# Patient Record
Sex: Male | Born: 1960 | Race: White | Hispanic: No | Marital: Single | Smoking: Never smoker
Health system: Southern US, Community
[De-identification: ages and names within clinical notes are randomized; demographics above are authoritative.]

## PROBLEM LIST (undated history)

## (undated) DIAGNOSIS — C4491 Basal cell carcinoma of skin, unspecified: Secondary | ICD-10-CM

## (undated) DIAGNOSIS — C4492 Squamous cell carcinoma of skin, unspecified: Secondary | ICD-10-CM

## (undated) DIAGNOSIS — T7840XA Allergy, unspecified, initial encounter: Secondary | ICD-10-CM

## (undated) HISTORY — DX: Squamous cell carcinoma of skin, unspecified: C44.92

## (undated) HISTORY — DX: Allergy, unspecified, initial encounter: T78.40XA

## (undated) HISTORY — DX: Basal cell carcinoma of skin, unspecified: C44.91

---

## 1999-07-27 DIAGNOSIS — L4050 Arthropathic psoriasis, unspecified: Secondary | ICD-10-CM | POA: Insufficient documentation

## 1999-07-27 DIAGNOSIS — E139 Other specified diabetes mellitus without complications: Secondary | ICD-10-CM | POA: Insufficient documentation

## 2007-07-22 DIAGNOSIS — I10 Essential (primary) hypertension: Secondary | ICD-10-CM | POA: Insufficient documentation

## 2007-07-22 DIAGNOSIS — E119 Type 2 diabetes mellitus without complications: Secondary | ICD-10-CM | POA: Insufficient documentation

## 2007-07-22 DIAGNOSIS — R809 Proteinuria, unspecified: Secondary | ICD-10-CM | POA: Insufficient documentation

## 2007-07-22 DIAGNOSIS — E7800 Pure hypercholesterolemia, unspecified: Secondary | ICD-10-CM | POA: Insufficient documentation

## 2013-06-25 ENCOUNTER — Ambulatory Visit (HOSPITAL_COMMUNITY)
Admission: RE | Admit: 2013-06-25 | Discharge: 2013-06-25 | Disposition: A | Discharge status: Home / Self Care | Payer: Managed Care, Other (non HMO) | Source: Ambulatory Visit | Attending: Orthopaedic Surgery | Admitting: Orthopaedic Surgery

## 2013-06-25 ENCOUNTER — Other Ambulatory Visit (HOSPITAL_COMMUNITY): Payer: Self-pay | Admitting: Orthopaedic Surgery

## 2013-06-25 DIAGNOSIS — M25562 Pain in left knee: Secondary | ICD-10-CM

## 2013-06-25 DIAGNOSIS — Z1389 Encounter for screening for other disorder: Secondary | ICD-10-CM | POA: Insufficient documentation

## 2015-03-24 IMAGING — CR DG ORBITS FOR FOREIGN BODY
2 series · 2 of 2 positions shown · non-contrast
Comparison: None.

CLINICAL DATA: Metal working/exposure; clearance prior to MRI

ORBITS FOR FOREIGN BODY - 2 VIEW

[w waters (1 of 2)]
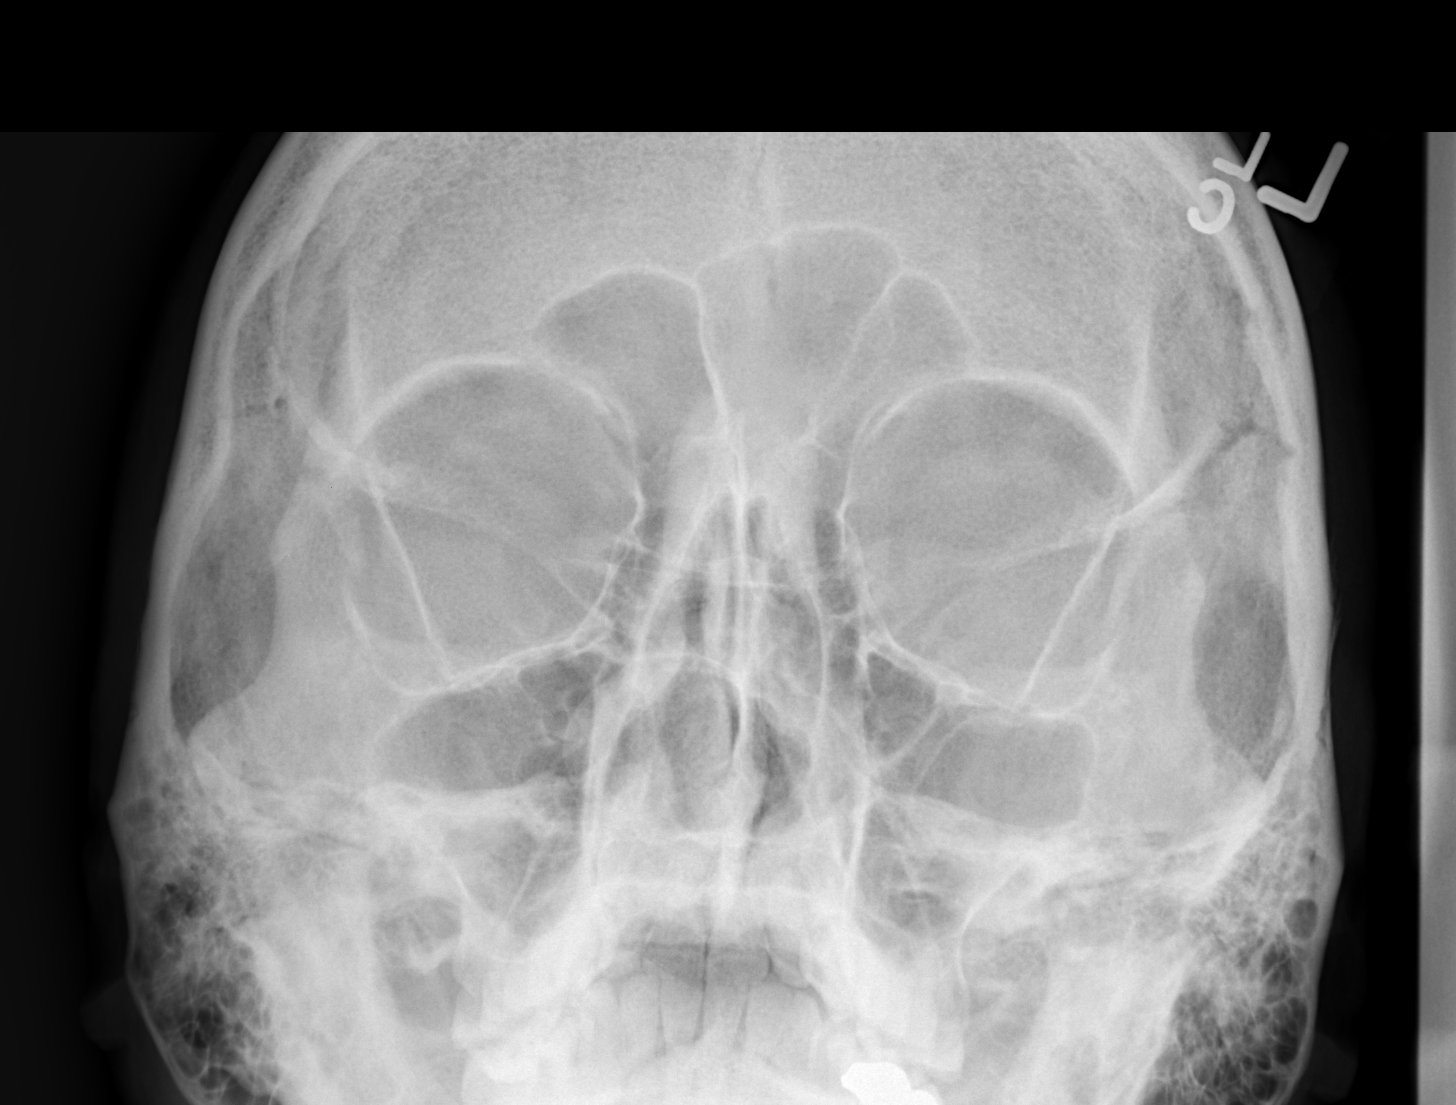

[w waters (2 of 2)]
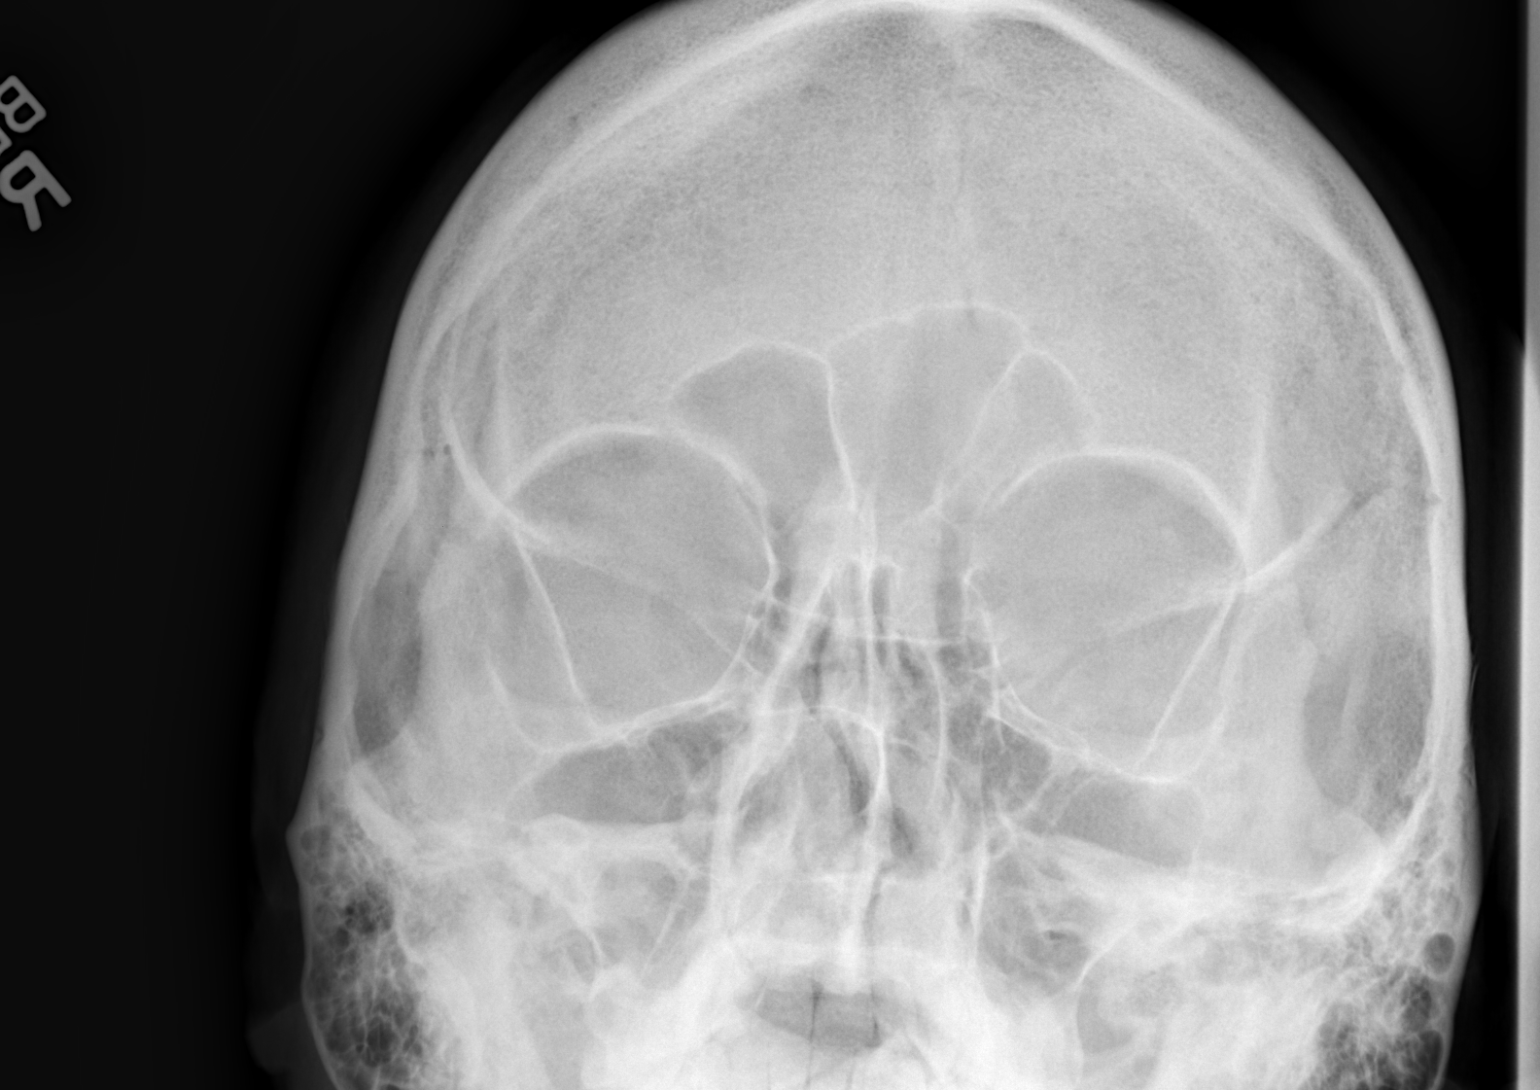

[2 of 2 positions shown; findings below may reference images not displayed]

FINDINGS: There is no evidence of metallic foreign body within the
orbits.  No significant bone abnormality identified.
IMPRESSION: No evidence of metallic foreign body within the orbits.

## 2016-09-17 DIAGNOSIS — R140 Abdominal distension (gaseous): Secondary | ICD-10-CM | POA: Insufficient documentation

## 2016-09-17 DIAGNOSIS — K828 Other specified diseases of gallbladder: Secondary | ICD-10-CM | POA: Insufficient documentation

## 2016-12-20 DIAGNOSIS — N400 Enlarged prostate without lower urinary tract symptoms: Secondary | ICD-10-CM | POA: Insufficient documentation

## 2016-12-20 DIAGNOSIS — J301 Allergic rhinitis due to pollen: Secondary | ICD-10-CM | POA: Insufficient documentation

## 2016-12-20 DIAGNOSIS — G4733 Obstructive sleep apnea (adult) (pediatric): Secondary | ICD-10-CM | POA: Insufficient documentation

## 2020-06-02 DIAGNOSIS — Z8639 Personal history of other endocrine, nutritional and metabolic disease: Secondary | ICD-10-CM | POA: Insufficient documentation

## 2020-06-02 DIAGNOSIS — N400 Enlarged prostate without lower urinary tract symptoms: Secondary | ICD-10-CM | POA: Insufficient documentation

## 2020-06-02 DIAGNOSIS — R030 Elevated blood-pressure reading, without diagnosis of hypertension: Secondary | ICD-10-CM | POA: Insufficient documentation

## 2020-06-02 DIAGNOSIS — I10 Essential (primary) hypertension: Secondary | ICD-10-CM | POA: Insufficient documentation

## 2020-06-02 DIAGNOSIS — M1990 Unspecified osteoarthritis, unspecified site: Secondary | ICD-10-CM | POA: Insufficient documentation

## 2020-09-22 DIAGNOSIS — E781 Pure hyperglyceridemia: Secondary | ICD-10-CM | POA: Insufficient documentation

## 2020-09-22 DIAGNOSIS — E785 Hyperlipidemia, unspecified: Secondary | ICD-10-CM | POA: Insufficient documentation

## 2021-03-09 ENCOUNTER — Ambulatory Visit (INDEPENDENT_AMBULATORY_CARE_PROVIDER_SITE_OTHER): Payer: BC Managed Care – PPO | Admitting: Dermatology

## 2021-03-09 ENCOUNTER — Other Ambulatory Visit: Payer: Self-pay

## 2021-03-09 ENCOUNTER — Encounter: Payer: Self-pay | Admitting: Dermatology

## 2021-03-09 DIAGNOSIS — Z1283 Encounter for screening for malignant neoplasm of skin: Secondary | ICD-10-CM

## 2021-03-09 DIAGNOSIS — D485 Neoplasm of uncertain behavior of skin: Secondary | ICD-10-CM | POA: Diagnosis not present

## 2021-03-09 DIAGNOSIS — Z85828 Personal history of other malignant neoplasm of skin: Secondary | ICD-10-CM | POA: Diagnosis not present

## 2021-03-09 DIAGNOSIS — L719 Rosacea, unspecified: Secondary | ICD-10-CM | POA: Diagnosis not present

## 2021-03-09 DIAGNOSIS — L570 Actinic keratosis: Secondary | ICD-10-CM | POA: Diagnosis not present

## 2021-03-09 MED ORDER — IVERMECTIN 1 % EX CREA: TOPICAL_CREAM | CUTANEOUS | 6 refills | Status: AC

## 2021-03-09 NOTE — Patient Instructions (Signed)

## 2021-03-12 NOTE — Progress Notes (Signed)
   Follow-Up Visit   Subjective  George Barrett is a 60 y.o. male who presents for the following: Skin Problem (Patient here today for lesion on the right side of his nose x 1 month no bleeding, per patient it's dry and non healing. Personal history of non mole skin cancer. No personal history of atypical moles, or melanoma. No family history of atypical moles, melanoma or non mole skin cancer.).  General skin check, spot on nose Location:  Duration:  Quality:  Associated Signs/Symptoms: Modifying Factors:  Severity:  Timing: Context:   Objective  Well appearing patient in no apparent distress; mood and affect are within normal limits. Left Breast Waist up skin examination, no atypical pigmented lesions.  Right Supratip of Nose Ill-defined fibrotic pearly 7 mm endophytic papule.     Left Nasal Sidewall Central facial slightly edematous patchy erythema  Left Mid Helix, Mid Parietal Scalp (2) Pink hornlike 3 to 4 mm crusts    A full examination was performed including scalp, head, eyes, ears, nose, lips, neck, chest, axillae, abdomen, back, buttocks, bilateral upper extremities, bilateral lower extremities, hands, feet, fingers, toes, fingernails, and toenails. All findings within normal limits unless otherwise noted below.   Assessment & Plan    Encounter for screening for malignant neoplasm of skin Left Breast  Annual skin examination, encouraged to self examine twice annually.  Neoplasm of uncertain behavior of skin Right Supratip of Nose  Skin / nail biopsy Type of biopsy: tangential   Informed consent: discussed and consent obtained   Timeout: patient name, date of birth, surgical site, and procedure verified   Procedure prep:  Patient was prepped and draped in usual sterile fashion (Non sterile) Prep type:  Chlorhexidine Anesthesia: the lesion was anesthetized in a standard fashion   Anesthetic:  1% lidocaine w/ epinephrine 1-100,000 local  infiltration Instrument used: flexible razor blade   Outcome: patient tolerated procedure well   Post-procedure details: wound care instructions given    Specimen 1 - Surgical pathology Differential Diagnosis: bcc vs scc  Check Margins: No  Will refer for Mohs surgery if biopsy shows skin cancer.  Rosacea Left Nasal Sidewall  Nightly application of cilantro (or generic ivermectin) areas prone to breakout.  Follow-up by phone in 6 weeks with status report  Ivermectin (SOOLANTRA) 1 % CREA - Left Nasal Sidewall Apply to affected area q day  AK (actinic keratosis) (3) Left Mid Helix; Mid Parietal Scalp (2)  Destruction of lesion - Left Mid Helix, Mid Parietal Scalp (2) Complexity: simple   Destruction method: cryotherapy   Informed consent: discussed and consent obtained   Timeout:  patient name, date of birth, surgical site, and procedure verified Lesion destroyed using liquid nitrogen: Yes   Cryotherapy cycles:  3 Outcome: patient tolerated procedure well with no complications        I, Lavonna Monarch, MD, have reviewed all documentation for this visit.  The documentation on 03/12/21 for the exam, diagnosis, procedures, and orders are all accurate and complete.

## 2021-03-25 ENCOUNTER — Telehealth: Payer: Self-pay | Admitting: *Deleted

## 2021-03-25 NOTE — Telephone Encounter (Signed)
Path to patient. He will call back in 1 month and if lesion is not gone he will make follow up appointment.

## 2021-09-30 ENCOUNTER — Other Ambulatory Visit: Payer: Self-pay

## 2021-09-30 ENCOUNTER — Ambulatory Visit (INDEPENDENT_AMBULATORY_CARE_PROVIDER_SITE_OTHER): Payer: BC Managed Care – PPO | Admitting: Dermatology

## 2021-09-30 ENCOUNTER — Encounter: Payer: Self-pay | Admitting: Dermatology

## 2021-09-30 DIAGNOSIS — L820 Inflamed seborrheic keratosis: Secondary | ICD-10-CM

## 2021-09-30 DIAGNOSIS — D0439 Carcinoma in situ of skin of other parts of face: Secondary | ICD-10-CM | POA: Diagnosis not present

## 2021-09-30 DIAGNOSIS — D1801 Hemangioma of skin and subcutaneous tissue: Secondary | ICD-10-CM

## 2021-09-30 DIAGNOSIS — D485 Neoplasm of uncertain behavior of skin: Secondary | ICD-10-CM

## 2021-09-30 DIAGNOSIS — D0422 Carcinoma in situ of skin of left ear and external auricular canal: Secondary | ICD-10-CM

## 2021-09-30 DIAGNOSIS — L918 Other hypertrophic disorders of the skin: Secondary | ICD-10-CM | POA: Diagnosis not present

## 2021-09-30 DIAGNOSIS — L570 Actinic keratosis: Secondary | ICD-10-CM | POA: Diagnosis not present

## 2021-09-30 NOTE — Patient Instructions (Signed)

## 2021-10-06 ENCOUNTER — Telehealth: Payer: Self-pay

## 2021-10-06 NOTE — Telephone Encounter (Signed)
Path to patient Feb visit made for the 2 lesions   2. 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

## 2021-10-06 NOTE — Telephone Encounter (Signed)
-----   Message from Lavonna Monarch, MD sent at 10/03/2021  5:02 PM EST ----- Schedule surgery with Dr. Darene Lamer

## 2021-10-30 ENCOUNTER — Encounter: Payer: Self-pay | Admitting: Dermatology

## 2021-10-30 NOTE — Progress Notes (Addendum)
Follow-Up Visit   Subjective  George Barrett is a 61 y.o. male who presents for the following: Annual Exam (Few scaly spots on face & left ear rim- removed in past but has grown back & tip of nose- dry spot).  Several new crusts face including recurrence Location:  Duration:  Quality:  Associated Signs/Symptoms: Modifying Factors:  Severity:  Timing: Context:   Objective  Well appearing patient in no apparent distress; mood and affect are within normal limits. Neck - Posterior Fleshy, skin-colored  pedunculated papules.    Abdomen (Lower Torso, Anterior), Chest (Upper Torso, Anterior), Mid Frontal Scalp, Torso - Posterior (Back) Multiple smooth red 1 mm dermal papules   Right Temporal Scalp 3 waxy pink 7 mm crust face, rule out superficial carcinomas       Right Zygomatic Area       Left Mid Helix       Left Parotid Area, Left Zygomatic Area, Mid Tip of Nose Multiple pink gritty crusts     All skin waist up examined.   Assessment & Plan    Skin tag Neck - Posterior  Benign, ok to leave unless pt wants removal   Cherry angioma (4) Chest (Upper Torso, Anterior); Abdomen (Lower Torso, Anterior); Torso - Posterior (Back); Mid Frontal Scalp  Benign, ok to leave   Neoplasm of uncertain behavior of skin (3) Right Temporal Scalp  Skin / nail biopsy Type of biopsy: tangential   Informed consent: discussed and consent obtained   Timeout: patient name, date of birth, surgical site, and procedure verified   Anesthesia: the lesion was anesthetized in a standard fashion   Anesthetic:  1% lidocaine w/ epinephrine 1-100,000 local infiltration Instrument used: flexible razor blade   Hemostasis achieved with: aluminum chloride and electrodesiccation   Outcome: patient tolerated procedure well   Post-procedure details: wound care instructions given    Specimen 1 - Surgical pathology Differential Diagnosis: R/O BCC VS SCC  Check Margins: No  Right  Zygomatic Area  Skin / nail biopsy Type of biopsy: tangential   Informed consent: discussed and consent obtained   Timeout: patient name, date of birth, surgical site, and procedure verified   Anesthesia: the lesion was anesthetized in a standard fashion   Anesthetic:  1% lidocaine w/ epinephrine 1-100,000 local infiltration Instrument used: flexible razor blade   Hemostasis achieved with: aluminum chloride and electrodesiccation   Outcome: patient tolerated procedure well   Post-procedure details: wound care instructions given    Specimen 2 - Surgical pathology Differential Diagnosis: R/O BCC VS SCC  Check Margins: No  Left Mid Helix  Skin / nail biopsy Type of biopsy: tangential   Informed consent: discussed and consent obtained   Timeout: patient name, date of birth, surgical site, and procedure verified   Anesthesia: the lesion was anesthetized in a standard fashion   Anesthetic:  1% lidocaine w/ epinephrine 1-100,000 local infiltration Instrument used: flexible razor blade   Hemostasis achieved with: aluminum chloride and electrodesiccation   Outcome: patient tolerated procedure well   Post-procedure details: wound care instructions given    Specimen 3 - Surgical pathology Differential Diagnosis: R/O BCC VS SCC  Check Margins: No  Actinic keratosis (3) Mid Tip of Nose; Left Zygomatic Area; Left Parotid Area  Destruction of lesion - Left Parotid Area, Left Zygomatic Area, Mid Tip of Nose Complexity: simple   Destruction method: cryotherapy   Informed consent: discussed and consent obtained   Timeout:  patient name, date of birth, surgical site, and  procedure verified Lesion destroyed using liquid nitrogen: Yes   Cryotherapy cycles:  3 Outcome: patient tolerated procedure well with no complications   Post-procedure details: wound care instructions given        I, Lavonna Monarch, MD, have reviewed all documentation for this visit.  The documentation on 11/23/21  for the exam, diagnosis, procedures, and orders are all accurate and complete.

## 2021-11-17 NOTE — Addendum Note (Signed)
Addended by: Lennie Odor on: 11/17/2021 10:13 AM   Modules accepted: Orders

## 2021-11-26 ENCOUNTER — Encounter: Payer: Self-pay | Admitting: Dermatology

## 2021-11-26 ENCOUNTER — Other Ambulatory Visit: Payer: Self-pay

## 2021-11-26 ENCOUNTER — Ambulatory Visit (INDEPENDENT_AMBULATORY_CARE_PROVIDER_SITE_OTHER): Payer: BC Managed Care – PPO | Admitting: Dermatology

## 2021-11-26 DIAGNOSIS — D485 Neoplasm of uncertain behavior of skin: Secondary | ICD-10-CM | POA: Diagnosis not present

## 2021-11-26 DIAGNOSIS — D0439 Carcinoma in situ of skin of other parts of face: Secondary | ICD-10-CM | POA: Diagnosis not present

## 2021-11-26 DIAGNOSIS — D0422 Carcinoma in situ of skin of left ear and external auricular canal: Secondary | ICD-10-CM

## 2021-11-26 DIAGNOSIS — D099 Carcinoma in situ, unspecified: Secondary | ICD-10-CM

## 2021-11-26 DIAGNOSIS — L570 Actinic keratosis: Secondary | ICD-10-CM | POA: Diagnosis not present

## 2021-11-26 NOTE — Patient Instructions (Signed)

## 2021-12-08 ENCOUNTER — Encounter: Payer: Self-pay | Admitting: Dermatology

## 2021-12-08 NOTE — Progress Notes (Signed)
Follow-Up Visit   Subjective  George Barrett is a 61 y.o. male who presents for the following: Procedure (Here for treatment- right zygomatic area & left mid helix- cis x 2 ).  Biopsy-proven skin cancers right cheek and left ear plus several new crusts Location:  Duration:  Quality:  Associated Signs/Symptoms: Modifying Factors:  Severity:  Timing: Context:   Objective  Well appearing patient in no apparent distress; mood and affect are within normal limits. Left Mid Helix Lesion identified by Dr.Tanna Loeffler and nurse in room.    Left Superior Helix 7 mm focally eroded waxy pink crust       Dorsum of Nose, Left Parotid Area Gritty 4 mm pink crusts  Right Zygomatic Area Lesion identified by Dr.Raphaella Larkin and nurse in room.      A focused examination was performed including head and neck. Relevant physical exam findings are noted in the Assessment and Plan.   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  Destruction of lesion Complexity: simple   Destruction method: electrodesiccation and curettage   Informed consent: discussed and consent obtained   Timeout:  patient name, date of birth, surgical site, and procedure verified Anesthesia: the lesion was anesthetized in a standard fashion   Anesthetic:  1% lidocaine w/ epinephrine 1-100,000 local infiltration Curettage performed in three different directions: Yes   Curettage cycles:  3 Lesion length (cm):  0.5 Lesion width (cm):  0.5 Margin per side (cm):  0 Final wound size (cm):  0.5 Hemostasis achieved with:  ferric subsulfate Outcome: patient tolerated procedure well with no complications   Post-procedure details: sterile dressing applied and wound care instructions given   Dressing type: bandage and petrolatum   Additional details:  Wound inoculated with 5% Fluorouracil.    Neoplasm of uncertain behavior of skin Left Superior Helix  Skin / nail  biopsy Type of biopsy: tangential   Informed consent: discussed and consent obtained   Timeout: patient name, date of birth, surgical site, and procedure verified   Anesthesia: the lesion was anesthetized in a standard fashion   Anesthetic:  1% lidocaine w/ epinephrine 1-100,000 local infiltration Instrument used: flexible razor blade   Hemostasis achieved with: ferric subsulfate and electrodesiccation   Outcome: patient tolerated procedure well   Post-procedure details: wound care instructions given    Destruction of lesion Complexity: simple   Destruction method: electrodesiccation and curettage   Informed consent: discussed and consent obtained   Timeout:  patient name, date of birth, surgical site, and procedure verified Anesthesia: the lesion was anesthetized in a standard fashion   Anesthetic:  1% lidocaine w/ epinephrine 1-100,000 local infiltration Curettage performed in three different directions: Yes   Electrodesiccation performed over the curetted area: Yes   Curettage cycles:  3 Lesion length (cm):  0.6 Lesion width (cm):  0.6 Margin per side (cm):  0 Final wound size (cm):  0.6 Hemostasis achieved with:  ferric subsulfate Outcome: patient tolerated procedure well with no complications   Post-procedure details: sterile dressing applied and wound care instructions given   Dressing type: bandage and petrolatum   Additional details:  Wound inoculated with 5% Fluorouracil.    Specimen 1 - Surgical pathology Differential Diagnosis: R/O BCC VS SCC - TXPBX  Check Margins: No  Actinic keratosis (2) Dorsum of Nose; Left Parotid Area  Destruction of lesion - Dorsum of Nose, Left Parotid Area Complexity: simple   Destruction method: cryotherapy  Informed consent: discussed and consent obtained   Lesion destroyed using liquid nitrogen: Yes   Cryotherapy cycles:  3 Outcome: patient tolerated procedure well with no complications    Squamous cell carcinoma in situ  (SCCIS) Right Zygomatic Area  Destruction of lesion Complexity: simple   Destruction method: electrodesiccation and curettage   Informed consent: discussed and consent obtained   Timeout:  patient name, date of birth, surgical site, and procedure verified Anesthesia: the lesion was anesthetized in a standard fashion   Anesthetic:  1% lidocaine w/ epinephrine 1-100,000 local infiltration Curettage performed in three different directions: Yes   Electrodesiccation performed over the curetted area: Yes   Curettage cycles:  3 Lesion length (cm):  0.6 Lesion width (cm):  0.6 Margin per side (cm):  0 Final wound size (cm):  0.6 Hemostasis achieved with:  ferric subsulfate Outcome: patient tolerated procedure well with no complications   Post-procedure details: sterile dressing applied and wound care instructions given   Dressing type: bandage and petrolatum   Additional details:  Wound inoculated with 5% Fluorouracil.        I, Lavonna Monarch, MD, have reviewed all documentation for this visit.  The documentation on 12/08/21 for the exam, diagnosis, procedures, and orders are all accurate and complete.

## 2022-03-15 ENCOUNTER — Ambulatory Visit (INDEPENDENT_AMBULATORY_CARE_PROVIDER_SITE_OTHER): Payer: BC Managed Care – PPO | Admitting: Dermatology

## 2022-03-15 ENCOUNTER — Encounter: Payer: Self-pay | Admitting: Dermatology

## 2022-03-15 DIAGNOSIS — L570 Actinic keratosis: Secondary | ICD-10-CM | POA: Diagnosis not present

## 2022-03-15 MED ORDER — IMIQUIMOD 5 % EX CREA: TOPICAL_CREAM | CUTANEOUS | 0 refills | Status: DC

## 2022-04-05 ENCOUNTER — Encounter: Payer: Self-pay | Admitting: Dermatology

## 2022-04-05 NOTE — Progress Notes (Signed)
   Follow-Up Visit   Subjective  George Barrett is a 62 y.o. male who presents for the following: Follow-up (F/u - left ear rim still little crusty & nose- peels).  Persistent crust at site of most recent biopsy on left ear, new crust on lower nose Location:  Duration:  Quality:  Associated Signs/Symptoms: Modifying Factors:  Severity:  Timing: Context:   Objective  Well appearing patient in no apparent distress; mood and affect are within normal limits. Left Superior Helix, Mid Supratip of Nose Left ear has been biopsied twice in the last year, superior helix showed a hypertrophic AK and mid helix showed a carcinoma in situ.  The persistent scale in the year is at the site of the AK biopsy and patient is agreeable to trying topical Aldara instead of repeat biopsy or freezing.  Smaller 3 mm gritty crust actinic keratosis on the lower ball of the nose.    A focused examination was performed including the head and neck. Relevant physical exam findings are noted in the Assessment and Plan.   Assessment & Plan    AK (actinic keratosis) (2) Left Superior Helix; Mid Supratip of Nose  Aldara 3 times weekly for 6 to 8 weeks or until there is brisk inflammation.  Contact me with any issues.  imiquimod (ALDARA) 5 % cream - Left Superior Helix, Mid Supratip of Nose Apply to affected area qhs x 23-76 application- avoid sunlight & expect irradiation      I, Lavonna Monarch, MD, have reviewed all documentation for this visit.  The documentation on 04/05/22 for the exam, diagnosis, procedures, and orders are all accurate and complete.

## 2022-04-23 DIAGNOSIS — E538 Deficiency of other specified B group vitamins: Secondary | ICD-10-CM | POA: Insufficient documentation

## 2022-10-11 ENCOUNTER — Ambulatory Visit: Payer: BC Managed Care – PPO | Admitting: Dermatology

## 2023-01-31 ENCOUNTER — Ambulatory Visit (INDEPENDENT_AMBULATORY_CARE_PROVIDER_SITE_OTHER): Payer: BC Managed Care – PPO | Admitting: Dermatology

## 2023-01-31 ENCOUNTER — Encounter: Payer: Self-pay | Admitting: Dermatology

## 2023-01-31 VITALS — BP 107/71

## 2023-01-31 DIAGNOSIS — L821 Other seborrheic keratosis: Secondary | ICD-10-CM | POA: Diagnosis not present

## 2023-01-31 DIAGNOSIS — L814 Other melanin hyperpigmentation: Secondary | ICD-10-CM

## 2023-01-31 DIAGNOSIS — L570 Actinic keratosis: Secondary | ICD-10-CM

## 2023-01-31 DIAGNOSIS — Z1283 Encounter for screening for malignant neoplasm of skin: Secondary | ICD-10-CM | POA: Diagnosis not present

## 2023-01-31 DIAGNOSIS — L820 Inflamed seborrheic keratosis: Secondary | ICD-10-CM | POA: Diagnosis not present

## 2023-01-31 DIAGNOSIS — D1801 Hemangioma of skin and subcutaneous tissue: Secondary | ICD-10-CM | POA: Diagnosis not present

## 2023-01-31 DIAGNOSIS — D229 Melanocytic nevi, unspecified: Secondary | ICD-10-CM

## 2023-01-31 DIAGNOSIS — X32XXXA Exposure to sunlight, initial encounter: Secondary | ICD-10-CM

## 2023-01-31 DIAGNOSIS — L578 Other skin changes due to chronic exposure to nonionizing radiation: Secondary | ICD-10-CM

## 2023-01-31 DIAGNOSIS — W908XXA Exposure to other nonionizing radiation, initial encounter: Secondary | ICD-10-CM

## 2023-01-31 NOTE — Patient Instructions (Addendum)
Due to recent changes in healthcare laws, you may see results of your pathology and/or laboratory studies on MyChart before the doctors have had a chance to review them. We understand that in some cases there may be results that are confusing or concerning to you. Please understand that not all results are received at the same time and often the doctors may need to interpret multiple results in order to provide you with the best plan of care or course of treatment. Therefore, we ask that you please give us 2 business days to thoroughly review all your results before contacting the office for clarification. Should we see a critical lab result, you will be contacted sooner.   If You Need Anything After Your Visit  If you have any questions or concerns for your doctor, please call our main line at [phone] If no one answers, please leave a voicemail as directed and we will return your call as soon as possible. Messages left after 4 pm will be answered the following business day.   You may also send us a message via MyChart. We typically respond to MyChart messages within 1-2 business days.  For prescription refills, please ask your pharmacy to contact our office. Our fax number is [phone].  If you have an urgent issue when the clinic is closed that cannot wait until the next business day, you can page your doctor at the number below.    Please note that while we do our best to be available for urgent issues outside of office hours, we are not available 24/7.   If you have an urgent issue and are unable to reach us, you may choose to seek medical care at your doctor's office, retail clinic, urgent care center, or emergency room.  If you have a medical emergency, please immediately call 911 or go to the emergency department. In the event of inclement weather, please call our main line at [phone] for an update on the status of any delays or closures.  Dermatology Medication Tips: Please  keep the boxes that topical medications come in in order to help keep track of the instructions about where and how to use these. Pharmacies typically print the medication instructions only on the boxes and not directly on the medication tubes.   If your medication is too expensive, please contact our office at [phone] or send us a message through MyChart.   We are unable to tell what your co-pay for medications will be in advance as this is different depending on your insurance coverage. However, we may be able to find a substitute medication at lower cost or fill out paperwork to get insurance to cover a needed medication.   If a prior authorization is required to get your medication covered by your insurance company, please allow us 1-2 business days to complete this process.  Drug prices often vary depending on where the prescription is filled and some pharmacies may offer cheaper prices.  The website www.goodrx.com contains coupons for medications through different pharmacies. The prices here do not account for what the cost may be with help from insurance (it may be cheaper with your insurance), but the website can give you the price if you did not use any insurance.  - You can print the associated coupon and take it with your prescription to the pharmacy.  - You may also stop by our office during regular business hours and pick up a GoodRx coupon card.  - If you need your   prescription sent electronically to a different pharmacy, notify our office through [facility] MyChart or by phone at [phone]    Skin Education :   I counseled the patient regarding the following: Sun screen (SPF 30 or greater) should be applied during peak UV exposure (between 10am and 2pm) and reapplied after exercise or swimming.  The ABCDEs of melanoma were reviewed with the patient, and the importance of monthly self-examination of moles was emphasized. Should any moles change in shape or color, or itch,  bleed or burn, pt will contact our office for evaluation sooner then their interval appointment.  Plan: Sunscreen Recommendations I recommended a broad spectrum sunscreen with a SPF of 30 or higher. I explained that SPF 30 sunscreens block approximately 97 percent of the sun's harmful rays. Sunscreens should be applied at least 15 minutes prior to expected sun exposure and then every 2 hours after that as long as sun exposure continues. If swimming or exercising sunscreen should be reapplied every 45 minutes to an hour after getting wet or sweating. One ounce, or the equivalent of a shot glass full of sunscreen, is adequate to protect the skin not covered by a bathing suit. I also recommended a lip balm with a sunscreen as well. Sun protective clothing can be used in lieu of sunscreen but must be worn the entire time you are exposed to the sun's rays. Cryotherapy Aftercare  Wash gently with soap and water everyday.   Apply Vaseline and Band-Aid daily until healed.  

## 2023-01-31 NOTE — Progress Notes (Unsigned)
New Patient Visit    Subjective  George Barrett is a 63 y.o. male who presents for the following: Lesions on ears, face, and neck x 2-3 months. He says he has a history of SCCIS on the left ear which he thinks was frozen or burned off. He has a history of AK'S.    The following portions of the chart were reviewed this encounter and updated as appropriate: medications, allergies, medical history  Review of Systems:  No other skin or systemic complaints except as noted in HPI or Assessment and Plan.  Objective  Well appearing patient in no apparent distress; mood and affect are within normal limits.  .  A waist up examination was performed of the following areas: Upper body exam.  Relevant exam findings are noted in the Assessment and Plan.    Assessment & Plan   LENTIGINES, SEBORRHEIC KERATOSES, HEMANGIOMAS - Benign normal skin lesions - Benign-appearing - Call for any changes  MELANOCYTIC NEVI - Tan-brown and/or pink-flesh-colored symmetric macules and papules - Benign appearing on exam today - Observation - Call clinic for new or changing moles - Recommend daily use of broad spectrum spf 30+ sunscreen to sun-exposed areas.   ACTINIC DAMAGE - Chronic condition, secondary to cumulative UV/sun exposure - diffuse scaly erythematous macules with underlying dyspigmentation - Recommend daily broad spectrum sunscreen SPF 30+ to sun-exposed areas, reapply every 2 hours as needed.  - Staying in the shade or wearing long sleeves, sun glasses (UVA+UVB protection) and wide brim hats (4-inch brim around the entire circumference of the hat) are also recommended for sun protection.  - Call for new or changing lesions.  SKIN CANCER SCREENING PERFORMED TODAY    INFLAMED SEBORRHEIC KERATOSIS Exam: Erythematous keratotic or waxy stuck-on papule or plaque.  Symptomatic, irritating, patient would like treated.  Benign-appearing.  Call clinic for new or changing lesions.   Prior to  procedure, discussed risks of blister formation, small wound, skin dyspigmentation, or rare scar following treatment. Recommend Vaseline ointment to treated areas while healing.  Destruction Procedure Note Destruction method: cryotherapy   Informed consent: discussed and consent obtained   Lesion destroyed using liquid nitrogen: Yes   Outcome: patient tolerated procedure well with no complications   Post-procedure details: wound care instructions given   Locations: left neck # of Lesions Treated: 1   ACTINIC KERATOSIS Exam: Erythematous thin papules/macules with gritty scale  Actinic keratoses are precancerous spots that appear secondary to cumulative UV radiation exposure/sun exposure over time. They are chronic with expected duration over 1 year. A portion of actinic keratoses will progress to squamous cell carcinoma of the skin. It is not possible to reliably predict which spots will progress to skin cancer and so treatment is recommended to prevent development of skin cancer.  Recommend daily broad spectrum sunscreen SPF 30+ to sun-exposed areas, reapply every 2 hours as needed.  Recommend staying in the shade or wearing long sleeves, sun glasses (UVA+UVB protection) and wide brim hats (4-inch brim around the entire circumference of the hat). Call for new or changing lesions.  Treatment Plan:  Prior to procedure, discussed risks of blister formation, small wound, skin dyspigmentation, or rare scar following cryotherapy. Recommend Vaseline ointment to treated areas while healing.  Destruction Procedure Note Destruction method: cryotherapy   Informed consent: discussed and consent obtained   Lesion destroyed using liquid nitrogen: Yes   Outcome: patient tolerated procedure well with no complications   Post-procedure details: wound care instructions given   Locations: right  temple, left cheek, left ear. Left wrist, right forearm # of Lesions Treated: 9      No follow-ups on  file.  Jaclynn Guarneri, CMA, am acting as scribe for Langston Reusing, MD.   Documentation: I have reviewed the above documentation for accuracy and completeness, and I agree with the above.  Langston Reusing, MD

## 2023-08-02 ENCOUNTER — Ambulatory Visit: Payer: BC Managed Care – PPO | Admitting: Dermatology

## 2023-08-04 DIAGNOSIS — E1142 Type 2 diabetes mellitus with diabetic polyneuropathy: Secondary | ICD-10-CM | POA: Insufficient documentation

## 2023-08-09 ENCOUNTER — Encounter: Payer: Self-pay | Admitting: Dermatology

## 2023-08-09 ENCOUNTER — Ambulatory Visit: Payer: BC Managed Care – PPO | Admitting: Dermatology

## 2023-08-09 VITALS — BP 123/83 | HR 69

## 2023-08-09 DIAGNOSIS — W908XXA Exposure to other nonionizing radiation, initial encounter: Secondary | ICD-10-CM | POA: Diagnosis not present

## 2023-08-09 DIAGNOSIS — L570 Actinic keratosis: Secondary | ICD-10-CM | POA: Diagnosis not present

## 2023-08-09 DIAGNOSIS — L578 Other skin changes due to chronic exposure to nonionizing radiation: Secondary | ICD-10-CM

## 2023-08-09 DIAGNOSIS — Z5111 Encounter for antineoplastic chemotherapy: Secondary | ICD-10-CM

## 2023-08-09 DIAGNOSIS — L918 Other hypertrophic disorders of the skin: Secondary | ICD-10-CM | POA: Diagnosis not present

## 2023-08-09 MED ORDER — FLUOROURACIL 5 % EX CREA: TOPICAL_CREAM | Freq: Every evening | CUTANEOUS | 1 refills | Status: AC

## 2023-08-09 MED ORDER — HYDROCORTISONE 2.5 % EX CREA: TOPICAL_CREAM | Freq: Two times a day (BID) | CUTANEOUS | 1 refills | Status: AC

## 2023-08-09 NOTE — Progress Notes (Signed)
Follow-Up Visit   Subjective  George Barrett is a 62 y.o. male who presents for the following: AK  Patient present today for follow up visit for AK. Patient was last evaluated on 01/31/23. Patient reports sxs are better. Patient denies medication changes.  The following portions of the chart were reviewed this encounter and updated as appropriate: medications, allergies, medical history  Review of Systems:  No other skin or systemic complaints except as noted in HPI or Assessment and Plan.  Objective  Well appearing patient in no apparent distress; mood and affect are within normal limits.  A focused examination was performed of the following areas: Face and neck  Relevant exam findings are noted in the Assessment and Plan.  Left Forearm - Posterior (3), Left Temple, Left Zygomatic Area (2), Right Buccal Cheek, Right Elbow - Posterior, Right Forearm - Posterior (2), Right Forehead (2), Right Malar Cheek, Right Preauricular Area, Right Temple Erythematous thin papules/macules with gritty scale.   Chest - Medial [facility]), Left Breast, Neck - Anterior, Neck - Posterior (3), Right Breast Fleshy, skin-colored pedunculated papules    Assessment & Plan   ACTINIC Damage Exam: Diffuse scaly erythematous macules with underlying dyspigmentation. Erythematous thin papules/macules with gritty scale at the temples, cheeks, forehead  Actinic keratoses are precancerous spots that appear secondary to cumulative UV radiation exposure/sun exposure over time. They are chronic with expected duration over 1 year. A portion of actinic keratoses will progress to squamous cell carcinoma of the skin. It is not possible to reliably predict which spots will progress to skin cancer and so treatment is recommended to prevent development of skin cancer.  Recommend daily broad spectrum sunscreen SPF 30+ to sun-exposed areas, reapply every 2 hours as needed.  Recommend staying in the shade or wearing long  sleeves, sun glasses (UVA+UVB protection) and wide brim hats (4-inch brim around the entire circumference of the hat). Call for new or changing lesions.  Treatment Plan: Start 5-fluorouracil cream twice a day for 14 days nightly to affected areas including face and arms.  Reviewed course of treatment and expected reaction.  Patient advised to expect inflammation and crusting and advised that erosions are possible.  Patient advised to be diligent with sun protection during and after treatment. Handout with details of how to apply medication and what to expect provided. Counseled to keep medication out of reach of children and pets.  Reviewed course of treatment and expected reaction.  Patient advised to expect inflammation and crusting and advised that erosions are possible.  Patient advised to be diligent with sun protection during and after treatment. Handout with details of how to apply medication and what to expect provided. Counseled to keep medication out of reach of children and pets.   Inflamed Skin Tags S: The patient complains of symptomatic skin tags on the neck. These are irritated by clothing, jewelry and rubbing.  Several irritated skin tags are noted on the neck.   Prior to procedure, discussed risks of blister formation, small wound, skin dyspigmentation, or rare scar following cryotherapy. Recommend Vaseline ointment to treated areas while healing.    PROCEDURE NOTES  Assessment & Plan   AK (actinic keratosis) (15) Right Elbow - Posterior; Left Forearm - Posterior (3); Right Forearm - Posterior (2); Left Temple; Right Temple; Right Preauricular Area; Left Zygomatic Area (2); Right Malar Cheek; Right Buccal Cheek; Right Forehead (2)  Destruction of lesion - Left Forearm - Posterior (3), Left Temple, Left Zygomatic Area (2), Right Buccal Cheek, Right  Elbow - Posterior, Right Forearm - Posterior (2), Right Forehead (2), Right Malar Cheek, Right Preauricular Area, Right  Temple Complexity: simple   Destruction method: cryotherapy   Informed consent: discussed and consent obtained   Timeout:  patient name, date of birth, surgical site, and procedure verified Lesion destroyed using liquid nitrogen: Yes   Cryotherapy cycles:  16 Post-procedure details: wound care instructions given    Related Medications hydrocortisone 2.5 % cream Apply topically 2 (two) times daily.  fluorouracil (EFUDEX) 5 % cream Apply topically at bedtime. START USE IN JANUARY  Inflamed skin tag (7) Neck - Anterior; Neck - Posterior (3); Chest - Medial [facility]); Left Breast; Right Breast  Symptomatic, irritating, patient would like treated.  Benign-appearing.  Call clinic for new or changing lesions.    Destruction of lesion - Chest - Medial [facility]), Left Breast, Neck - Anterior, Neck - Posterior (3), Right Breast Complexity: simple   Destruction method: cryotherapy   Informed consent: discussed and consent obtained   Timeout:  patient name, date of birth, surgical site, and procedure verified Lesion destroyed using liquid nitrogen: Yes   Cryotherapy cycles:  5 Post-procedure details: wound care instructions given       Return in about 5 months (around 01/07/2024) for AK F/U.    Documentation: I have reviewed the above documentation for accuracy and completeness, and I agree with the above.  Stasia Cavalier, am acting as scribe for Langston Reusing, DO.  Langston Reusing, DO

## 2023-08-09 NOTE — Patient Instructions (Addendum)
Hello George Barrett,  Thank you for visiting [place] today. We appreciate your commitment to managing your skin health, especially in addressing the precancerous spots and preparing for winter skin care. Here is a summary of the key instructions from today's consultation:  - Freezing Treatment: Several spots were treated today, including on your left wrist and a spot of actinic keratosis.   - Application: Continue to apply Aquaphor or Vaseline on these treated areas morning and night starting tonight.  - Field Therapy Plan for January:   - Treatment: We discussed starting Efudex (5-Fluorouracil) cream in January.   - Application: Apply this cream at night for two weeks to the forehead, nose, and cheeks.  - Post-Efudex Care:   - Treatment: After the two-week application of Efudex, use hydrocortisone 2.5% ointment for another two weeks to aid healing.  - Daily Skin Care:   - Routine: Use Aquaphor and sunscreen every morning. This is crucial for protecting and healing your skin.  - Follow-Up Appointment:   - Schedule: We will see you back in April for a full skin cancer screening and to assess the treatment's effectiveness.  - Educational Material:   - Resources Provided: I showed you some images of expected reactions to the treatments and provided a sample of Vaseline to start your care this evening.  We look forward to seeing the positive changes in your next visit. If you have any questions about the treatment or experience severe irritation, please do not hesitate to contact our office.  Wishing you a healthy and safe winter season.  Warm regards,  Dr. Langston Reusing,  Dermatology      Cryotherapy Aftercare  Wash gently with soap and water everyday.   Apply Vaseline and Band-Aid daily until healed.    Important Information   Due to recent changes in healthcare laws, you may see results of your pathology and/or laboratory studies on MyChart before the doctors have had a chance  to review them. We understand that in some cases there may be results that are confusing or concerning to you. Please understand that not all results are received at the same time and often the doctors may need to interpret multiple results in order to provide you with the best plan of care or course of treatment. Therefore, we ask that you please give [place] 2 business days to thoroughly review all your results before contacting the office for clarification. Should we see a critical lab result, you will be contacted sooner.     If You Need Anything After Your Visit   If you have any questions or concerns for your doctor, please call our main line at [phone]. If no one answers, please leave a voicemail as directed and we will return your call as soon as possible. Messages left after 4 pm will be answered the following business day.    You may also send [place] a message via MyChart. We typically respond to MyChart messages within 1-2 business days.  For prescription refills, please ask your pharmacy to contact our office. Our fax number is [phone].  If you have an urgent issue when the clinic is closed that cannot wait until the next business day, you can page your doctor at the number below.     Please note that while we do our best to be available for urgent issues outside of office hours, we are not available 24/7.    If you have an urgent issue and are unable to reach [place], you  may choose to seek medical care at your doctor's office, retail clinic, urgent care center, or emergency room.   If you have a medical emergency, please immediately call 911 or go to the emergency department. In the event of inclement weather, please call our main line at [phone] for an update on the status of any delays or closures.  Dermatology Medication Tips: Please keep the boxes that topical medications come in in order to help keep track of the instructions about where and how to use these. Pharmacies  typically print the medication instructions only on the boxes and not directly on the medication tubes.   If your medication is too expensive, please contact our office at [phone] or send [place] a message through MyChart.    We are unable to tell what your co-pay for medications will be in advance as this is different depending on your insurance coverage. However, we may be able to find a substitute medication at lower cost or fill out paperwork to get insurance to cover a needed medication.    If a prior authorization is required to get your medication covered by your insurance company, please allow [place] 1-2 business days to complete this process.   Drug prices often vary depending on where the prescription is filled and some pharmacies may offer cheaper prices.   The website www.goodrx.com contains coupons for medications through different pharmacies. The prices here do not account for what the cost may be with help from insurance (it may be cheaper with your insurance), but the website can give you the price if you did not use any insurance.  - You can print the associated coupon and take it with your prescription to the pharmacy.  - You may also stop by our office during regular business hours and pick up a GoodRx coupon card.  - If you need your prescription sent electronically to a different pharmacy, notify our office through [facility] or by phone at [phone]

## 2023-10-04 ENCOUNTER — Other Ambulatory Visit: Payer: Self-pay | Admitting: Dermatology

## 2023-10-04 DIAGNOSIS — L570 Actinic keratosis: Secondary | ICD-10-CM

## 2024-01-10 ENCOUNTER — Ambulatory Visit (INDEPENDENT_AMBULATORY_CARE_PROVIDER_SITE_OTHER): Payer: BC Managed Care – PPO | Admitting: Dermatology

## 2024-01-10 DIAGNOSIS — L578 Other skin changes due to chronic exposure to nonionizing radiation: Secondary | ICD-10-CM | POA: Diagnosis not present

## 2024-01-10 DIAGNOSIS — W908XXA Exposure to other nonionizing radiation, initial encounter: Secondary | ICD-10-CM | POA: Diagnosis not present

## 2024-01-10 DIAGNOSIS — L570 Actinic keratosis: Secondary | ICD-10-CM | POA: Diagnosis not present

## 2024-01-10 DIAGNOSIS — L821 Other seborrheic keratosis: Secondary | ICD-10-CM | POA: Diagnosis not present

## 2024-01-10 NOTE — Progress Notes (Signed)
   Follow-Up Visit   Subjective  George Barrett is a 63 y.o. male who presents for the following: 5 month ak follow up. hx of aks at temples, cheeks, forehead Was instructed to use 5 f/u field treatment cream to face and arms nightly for 14 days. Patient reports did get red and crusty at face. Patient reports he did not use treatment at arms.     The patient has spots, moles and lesions to be evaluated, some may be new or changing and the patient may have concern these could be cancer.   The following portions of the chart were reviewed this encounter and updated as appropriate: medications, allergies, medical history  Review of Systems:  No other skin or systemic complaints except as noted in HPI or Assessment and Plan.  Objective  Well appearing patient in no apparent distress; mood and affect are within normal limits.   A focused examination was performed of the following areas: Face, ears, scalp, arms, hands  Relevant exam findings are noted in the Assessment and Plan.    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 - Observe - Call for any changes   ACTINIC DAMAGE - chronic, secondary to cumulative UV radiation exposure/sun exposure over time - diffuse scaly erythematous macules with underlying dyspigmentation - Recommend daily broad spectrum sunscreen SPF 30+ to sun-exposed areas, reapply every 2 hours as needed.  - Recommend staying in the shade or wearing long sleeves, sun glasses (UVA+UVB protection) and wide brim hats (4-inch brim around the entire circumference of the hat). - Call for new or changing lesions.  - Assessment: Patient previously treated with topical cream for actinic keratoses. Treatment was effective, causing redness in spots, particularly in areas with older, looser skin. Some areas still showing spotting where treatment  worked, taking time to fade. One remaining AK identified on the right cheek during examination.  - Plan:    Cryotherapy for single AK on right cheek    Provide cryo-aftercare instructions    Schedule follow-up skin check in November    Transition to annual skin checks after next visit due to effectiveness of cream treatment    Continue sun protection measures (hats, sunscreen, umbrellas)    Provide large Excedrin sunscreen for summer use    No follow-ups on file.  I, Asher Muir, CMA, am acting as scribe for [employer], DO.   Documentation: I have reviewed the above documentation for accuracy and completeness, and I agree with the above.  Langston Reusing, DO

## 2024-01-10 NOTE — Patient Instructions (Addendum)
 Actinic keratoses are precancerous spots that appear secondary to cumulative UV radiation exposure/sun exposure over time. They are chronic with expected duration over 1 year. A portion of actinic keratoses will progress to squamous cell carcinoma of the skin. It is not possible to reliably predict which spots will progress to skin cancer and so treatment is recommended to prevent development of skin cancer.  Recommend daily broad spectrum sunscreen SPF 30+ to sun-exposed areas, reapply every 2 hours as needed.  Recommend staying in the shade or wearing long sleeves, sun glasses (UVA+UVB protection) and wide brim hats (4-inch brim around the entire circumference of the hat). Call for new or changing lesions.    Cryotherapy Aftercare  Wash gently with soap and water everyday.   Apply Vaseline and Band-Aid daily until healed.       Important Information  Due to recent changes in healthcare laws, you may see results of your pathology and/or laboratory studies on MyChart before the doctors have had a chance to review them. We understand that in some cases there may be results that are confusing or concerning to you. Please understand that not all results are received at the same time and often the doctors may need to interpret multiple results in order to provide you with the best plan of care or course of treatment. Therefore, we ask that you please give [place] 2 business days to thoroughly review all your results before contacting the office for clarification. Should we see a critical lab result, you will be contacted sooner.   If You Need Anything After Your Visit  If you have any questions or concerns for your doctor, please call our main line at [phone] If no one answers, please leave a voicemail as directed and we will return your call as soon as possible. Messages left after 4 pm will be answered the following business day.   You may also send [place] a message via MyChart. We typically  respond to MyChart messages within 1-2 business days.  For prescription refills, please ask your pharmacy to contact our office. Our fax number is [phone].  If you have an urgent issue when the clinic is closed that cannot wait until the next business day, you can page your doctor at the number below.    Please note that while we do our best to be available for urgent issues outside of office hours, we are not available 24/7.   If you have an urgent issue and are unable to reach [place], you may choose to seek medical care at your doctor's office, retail clinic, urgent care center, or emergency room.  If you have a medical emergency, please immediately call 911 or go to the emergency department. In the event of inclement weather, please call our main line at [phone] for an update on the status of any delays or closures.  Dermatology Medication Tips: Please keep the boxes that topical medications come in in order to help keep track of the instructions about where and how to use these. Pharmacies typically print the medication instructions only on the boxes and not directly on the medication tubes.   If your medication is too expensive, please contact our office at [phone] or send [place] a message through MyChart.   We are unable to tell what your co-pay for medications will be in advance as this is different depending on your insurance coverage. However, we may be able to find a substitute medication at lower cost or fill out  paperwork to get insurance to cover a needed medication.   If a prior authorization is required to get your medication covered by your insurance company, please allow [place] 1-2 business days to complete this process.  Drug prices often vary depending on where the prescription is filled and some pharmacies may offer cheaper prices.  The website www.goodrx.com contains coupons for medications through different pharmacies. The prices here do not account for what the cost  may be with help from insurance (it may be cheaper with your insurance), but the website can give you the price if you did not use any insurance.  - You can print the associated coupon and take it with your prescription to the pharmacy.  - You may also stop by our office during regular business hours and pick up a GoodRx coupon card.  - If you need your prescription sent electronically to a different pharmacy, notify our office through [facility] or by phone at [phone]

## 2024-08-13 ENCOUNTER — Ambulatory Visit: Admitting: Dermatology

## 2025-01-01 ENCOUNTER — Ambulatory Visit: Admitting: Dermatology
